# Patient Record
Sex: Male | Born: 1995 | Hispanic: No | Marital: Single | Smoking: Never smoker
Health system: Southern US, Community
[De-identification: ages and names within clinical notes are randomized; demographics above are authoritative.]

## PROBLEM LIST (undated history)

## (undated) HISTORY — PX: SHOULDER SURGERY: SHX246

---
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
## 2015-05-19 IMAGING — RF DG FLUORO GUIDE NDL PLC/BX
3 series · 3 of 3 positions shown · non-contrast
Comparison: none

CLINICAL DATA: Right shoulder pain.  Dislocation.  Bankart lesion.

[Series 1: (hospital) · 1 of 1 slices shown (1 of 3)]
[im 1/1]
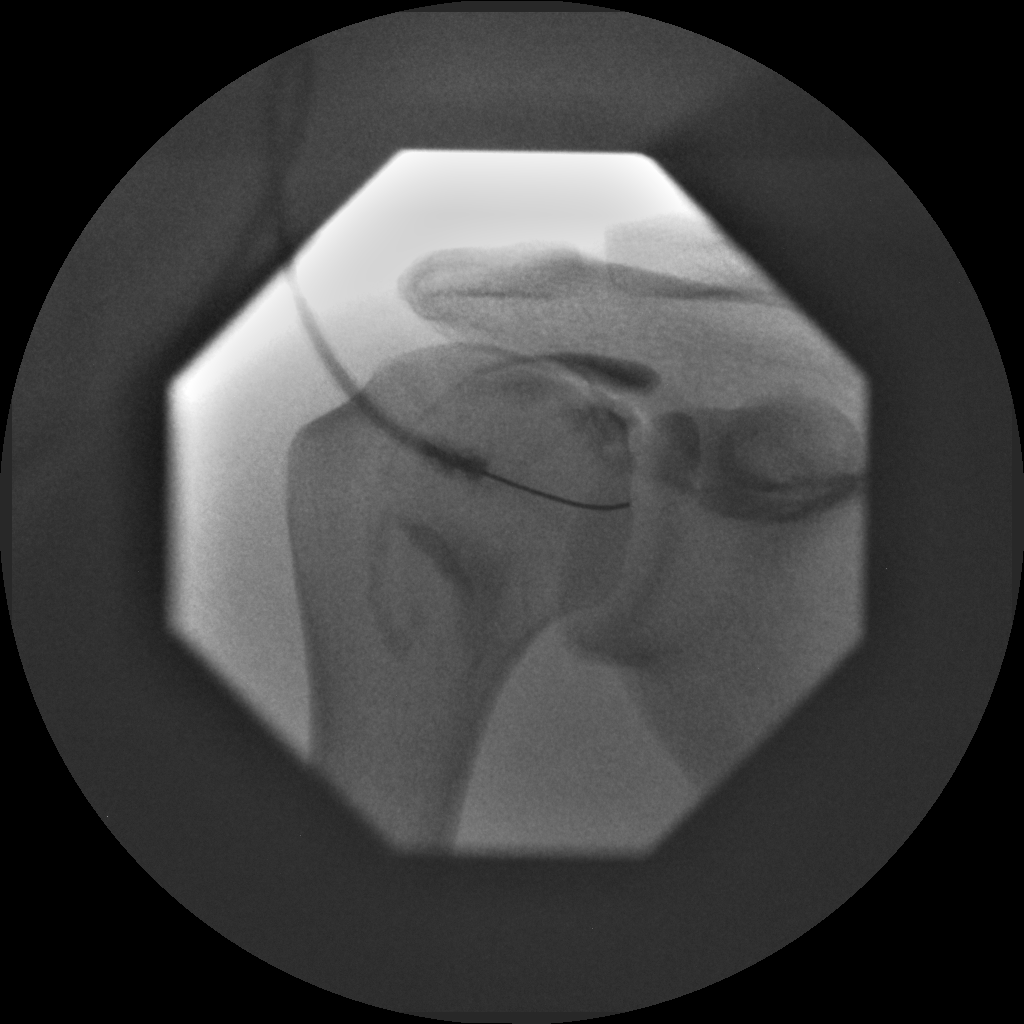

[Series 2: (hospital) · 1 of 1 slices shown (2 of 3)]
[im 1/1]
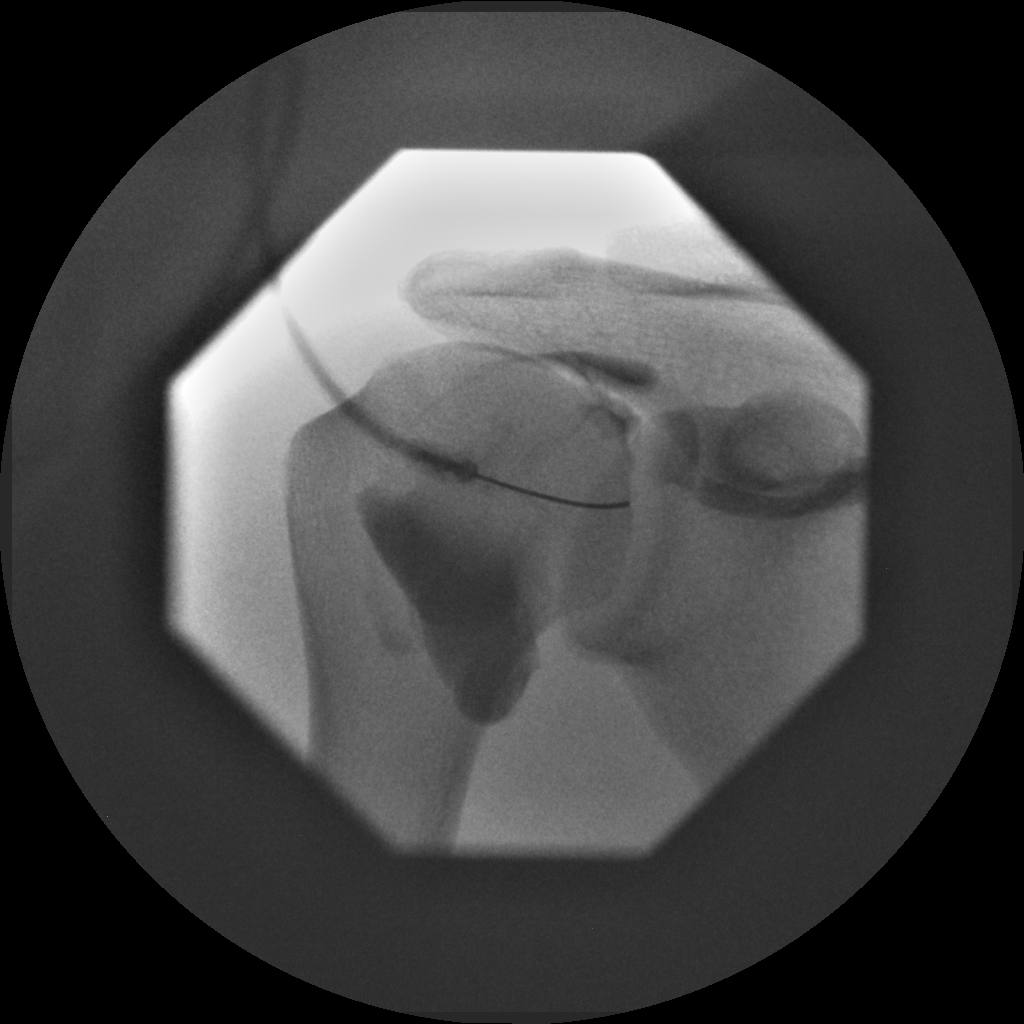

[Series 3: (hospital) · 1 of 1 slices shown (3 of 3)]
[im 1/1]
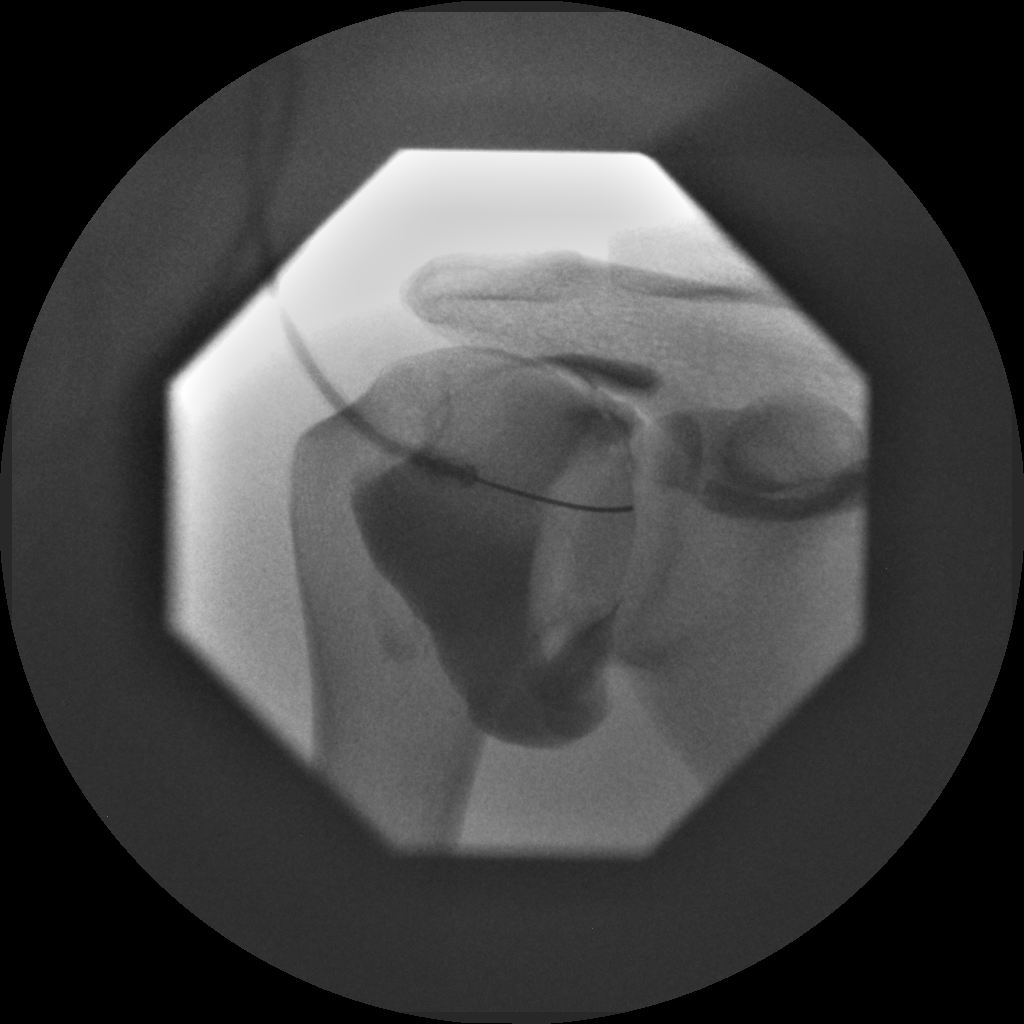

[3 of 3 positions shown; findings below may reference images not displayed]

FLUOROSCOPY TIME:  20 seconds.

PROCEDURE:
Right SHOULDER INJECTION UNDER FLUOROSCOPY

An appropriate skin entrance site was determined. The site was
marked, prepped with Betadine, draped in the usual sterile fashion,
and infiltrated locally with buffered Lidocaine. 22 gauge spinal
needle was advanced to the superomedial margin of the humeral head
under intermittent fluoroscopy. 1 ml of Lidocaine injected easily. A
mixture of 0.1 ml Multihance and 20 ml of dilute Omnipaque 180 was
then used to opacify the right shoulder capsule. No immediate
complication.
IMPRESSION: Technically successful right shoulder injection for MRI.

## 2020-06-14 ENCOUNTER — Encounter (HOSPITAL_COMMUNITY): Payer: Self-pay

## 2020-06-14 ENCOUNTER — Other Ambulatory Visit: Payer: Self-pay

## 2020-06-14 ENCOUNTER — Ambulatory Visit (HOSPITAL_COMMUNITY)
Admission: EM | Admit: 2020-06-14 | Discharge: 2020-06-14 | Disposition: A | Discharge status: Home / Self Care | Payer: Managed Care, Other (non HMO) | Attending: Family Medicine | Admitting: Family Medicine

## 2020-06-14 DIAGNOSIS — R319 Hematuria, unspecified: Secondary | ICD-10-CM | POA: Insufficient documentation

## 2020-06-14 DIAGNOSIS — R300 Dysuria: Secondary | ICD-10-CM | POA: Insufficient documentation

## 2020-06-14 DIAGNOSIS — R509 Fever, unspecified: Secondary | ICD-10-CM | POA: Insufficient documentation

## 2020-06-14 LAB — POCT URINALYSIS DIPSTICK, ED / UC
Bilirubin Urine: NEGATIVE
Glucose, UA: NEGATIVE mg/dL
Ketones, ur: NEGATIVE mg/dL
Nitrite: NEGATIVE
Protein, ur: NEGATIVE mg/dL
Specific Gravity, Urine: 1.015 (ref 1.005–1.030)
Urobilinogen, UA: 0.2 mg/dL (ref 0.0–1.0)
pH: 7 (ref 5.0–8.0)

## 2020-06-14 MED ORDER — NITROFURANTOIN MONOHYD MACRO 100 MG PO CAPS: 100.0000 mg | ORAL_CAPSULE | Freq: Two times a day (BID) | ORAL | 0 refills | Status: AC

## 2020-06-14 NOTE — ED Provider Notes (Signed)
[facility]   CC: UTI  SUBJECTIVE:  Vincent Rice is a 24 y.o. male who complains of urinary frequency, urgency, fever, hematuria and dysuria for the past 2 days.  Patient denies a precipitating event, recent sexual encounter, excessive caffeine intake. Localizes the pain to the lower abdomen. Pain is intermittent and describes it as sharp. Has tried OTC medications without relief. Symptoms are made worse with urination. Denies similar symptoms in the past.  Denies chills, nausea, vomiting, flank pain  ROS: As in HPI.  All other pertinent ROS negative.     History reviewed. No pertinent past medical history. Past Surgical History:  Procedure Laterality Date  . SHOULDER SURGERY     No Known Allergies No current facility-administered medications on file prior to encounter.   Current Outpatient Medications on File Prior to Encounter  Medication Sig Dispense Refill  . chlorpheniramine-HYDROcodone (TUSSIONEX PENNKINETIC ER) 10-8 MG/5ML SUER Take 5 mLs by mouth every 12 (twelve) hours as needed for cough. 140 mL 0  . fluticasone (FLONASE) 50 MCG/ACT nasal spray Place 1 spray into both nostrils 2 (two) times daily. 1 g 2  . HYDROcodone-acetaminophen (NORCO/VICODIN) 5-325 MG per tablet Take 1 tablet by mouth every 6 (six) hours as needed for moderate pain. 15 tablet 0  . ibuprofen (ADVIL,MOTRIN) 800 MG tablet Take 1 tablet (800 mg total) by mouth every 8 (eight) hours as needed. 21 tablet 0  . Multiple Vitamins-Minerals (MULTIVITAMIN & MINERAL PO) Take 1 tablet by mouth daily.     Social History   Socioeconomic History  . Marital status: Single    Spouse name: Not on file  . Number of children: Not on file  . Years of education: Not on file  . Highest education level: Not on file  Occupational History  . Not on file  Tobacco Use  . Smoking status: Never Smoker  Substance and Sexual Activity  . Alcohol use: No  . Drug use: Not on file  . Sexual activity: Not on file    Other Topics Concern  . Not on file  Social History Narrative  . Not on file   Social Determinants of Health   Financial Resource Strain:   . Difficulty of Paying Living Expenses: Not on file  Food Insecurity:   . Worried About Programme researcher, broadcasting/film/video in the Last Year: Not on file  . Ran Out of Food in the Last Year: Not on file  Transportation Needs:   . Lack of Transportation (Medical): Not on file  . Lack of Transportation (Non-Medical): Not on file  Physical Activity:   . Days of Exercise per Week: Not on file  . Minutes of Exercise per Session: Not on file  Stress:   . Feeling of Stress : Not on file  Social Connections:   . Frequency of Communication with Friends and Family: Not on file  . Frequency of Social Gatherings with Friends and Family: Not on file  . Attends Religious Services: Not on file  . Active Member of Clubs or Organizations: Not on file  . Attends Banker Meetings: Not on file  . Marital Status: Not on file  Intimate Partner Violence:   . Fear of Current or Ex-Partner: Not on file  . Emotionally Abused: Not on file  . Physically Abused: Not on file  . Sexually Abused: Not on file   Family History  Family history unknown: Yes    OBJECTIVE:  Vitals:   06/14/20 1120  BP: 110/66  Pulse: 85  Resp: 18  Temp: 98.6 F (37 C)  TempSrc: Oral  SpO2: 100%   General appearance: AOx3 in no acute distress HEENT: NCAT. Oropharynx clear.  Lungs: clear to auscultation bilaterally without adventitious breath sounds Heart: regular rate and rhythm. Radial pulses 2+ symmetrical bilaterally Abdomen: soft; non-distended; suprapubic tenderness; bowel sounds present; no guarding or rebound tenderness Back: no CVA tenderness Extremities: no edema; symmetrical with no gross deformities Skin: warm and dry Neurologic: Ambulates from chair to exam table without difficulty Psychological: alert and cooperative; normal mood and affect  Labs Reviewed  POCT  URINALYSIS DIPSTICK, ED / UC - Abnormal; Notable for the following components:      Result Value   Hgb urine dipstick LARGE (*)    Leukocytes,Ua SMALL (*)    All other components within normal limits  URINE CULTURE    ASSESSMENT & PLAN:  1. Dysuria   2. Fever, unspecified fever cause   3. Hematuria, unspecified type     Meds ordered this encounter  Medications  . nitrofurantoin, macrocrystal-monohydrate, (MACROBID) 100 MG capsule    Sig: Take 1 capsule (100 mg total) by mouth 2 (two) times daily.    Dispense:  10 capsule    Refill:  0    Order Specific Question:   Supervising Provider    Answer:   Merrilee Jansky [[id number]]   Macrobid prescribed  Urine culture sent  We will call you with abnormal results that need further treatment Push fluids and get plenty of rest Take antibiotic as directed and to completion Take pyridium as prescribed and as needed for symptomatic relief Follow up with PCP if symptoms persists Return here or go to ER if you have any new or worsening symptoms such as fever, worsening abdominal pain, nausea/vomiting, flank pain  Outlined signs and symptoms indicating need for more acute intervention Patient verbalized understanding After Visit Summary given     Moshe Cipro, NP 06/14/20 1330

## 2020-06-14 NOTE — ED Triage Notes (Signed)
Pt presents with blood in urine since yesterday. 

## 2020-06-14 NOTE — Discharge Instructions (Addendum)
You may have a urinary tract infection. I have sent in Macrobid for you to take twice a day for 5 days.  We are going to culture your urine and will call you as soon as we have the results.   Drink plenty of water, 8-10 glasses per day.   You may take AZO over the counter for painful urination.  Follow up with your primary care provider as needed.   Go to the Emergency Department if you experience severe pain, shortness of breath, high fever, or other concerns.   

## 2020-06-16 ENCOUNTER — Telehealth (HOSPITAL_COMMUNITY): Payer: Self-pay | Admitting: Emergency Medicine

## 2020-06-16 LAB — URINE CULTURE: Culture: 20000 — AB

## 2020-06-16 MED ORDER — SULFAMETHOXAZOLE-TRIMETHOPRIM 800-160 MG PO TABS: 1.0000 | ORAL_TABLET | Freq: Two times a day (BID) | ORAL | 0 refills | Status: AC
